# Patient Record
Sex: Male | Born: 1950 | Race: White | Hispanic: No | Marital: Married | Smoking: Never smoker
Health system: Southern US, Community
[De-identification: ages and names within clinical notes are randomized; demographics above are authoritative.]

## PROBLEM LIST (undated history)

## (undated) DIAGNOSIS — M5126 Other intervertebral disc displacement, lumbar region: Secondary | ICD-10-CM

## (undated) DIAGNOSIS — M1990 Unspecified osteoarthritis, unspecified site: Secondary | ICD-10-CM

## (undated) DIAGNOSIS — N189 Chronic kidney disease, unspecified: Secondary | ICD-10-CM

## (undated) DIAGNOSIS — G4730 Sleep apnea, unspecified: Secondary | ICD-10-CM

## (undated) DIAGNOSIS — I10 Essential (primary) hypertension: Secondary | ICD-10-CM

## (undated) HISTORY — PX: VASECTOMY: SHX75

## (undated) HISTORY — PX: TONSILLECTOMY: SUR1361

## (undated) HISTORY — PX: FRACTURE SURGERY: SHX138

## (undated) HISTORY — PX: APPENDECTOMY: SHX54

## (undated) HISTORY — PX: SHOULDER ARTHROSCOPY: SHX128

## (undated) HISTORY — PX: EYE SURGERY: SHX253

---

## 2004-10-13 ENCOUNTER — Encounter: Payer: Self-pay | Admitting: Internal Medicine

## 2004-10-24 ENCOUNTER — Encounter: Payer: Self-pay | Admitting: Internal Medicine

## 2004-12-01 ENCOUNTER — Ambulatory Visit: Payer: Self-pay | Admitting: Gastroenterology

## 2005-03-04 ENCOUNTER — Ambulatory Visit: Payer: Self-pay | Admitting: Gastroenterology

## 2005-04-08 ENCOUNTER — Ambulatory Visit: Payer: Self-pay | Admitting: Surgery

## 2006-11-04 ENCOUNTER — Ambulatory Visit: Payer: Self-pay | Admitting: Internal Medicine

## 2006-11-15 ENCOUNTER — Ambulatory Visit: Payer: Self-pay | Admitting: Internal Medicine

## 2008-09-23 ENCOUNTER — Emergency Department: Payer: Self-pay | Admitting: Emergency Medicine

## 2010-04-29 ENCOUNTER — Ambulatory Visit: Payer: Self-pay | Admitting: Family Medicine

## 2010-04-30 ENCOUNTER — Ambulatory Visit: Payer: Self-pay | Admitting: Urology

## 2012-04-26 HISTORY — PX: BACK SURGERY: SHX140

## 2012-07-07 ENCOUNTER — Ambulatory Visit: Payer: Self-pay | Admitting: Internal Medicine

## 2012-08-21 IMAGING — CT CT STONE STUDY
1 of 2 series · 16 of 32 positions shown, 20 images · non-contrast
Comparison: none

REASON FOR EXAM: CR  [id number]  flank pain hematuria  eval for kidney stones
COMMENTS:

PROCEDURE:     KCT - KCT ABDOMEN/PELVIS WO (STONE)  - April 29, 2010 [DATE]
RESULT:     History: Flank pain. Hematuria.

[Series 2: stonelg 3.0 i40s · axial · 0.96mm/px · z∈[-1233,-741]mm · 16 of 180 slices shown, 20 images]
[im 8/180  soft-tissue]
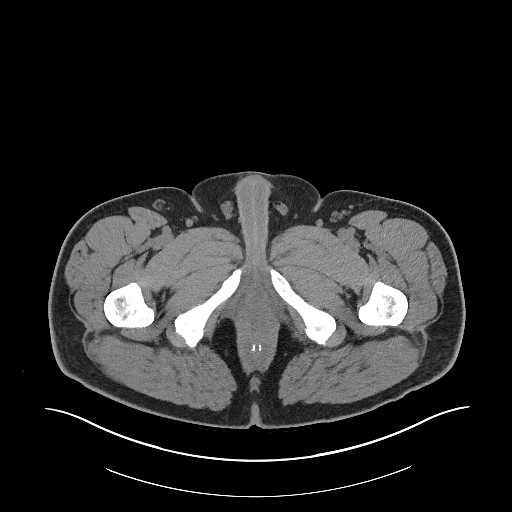
[im 8/180  bone]
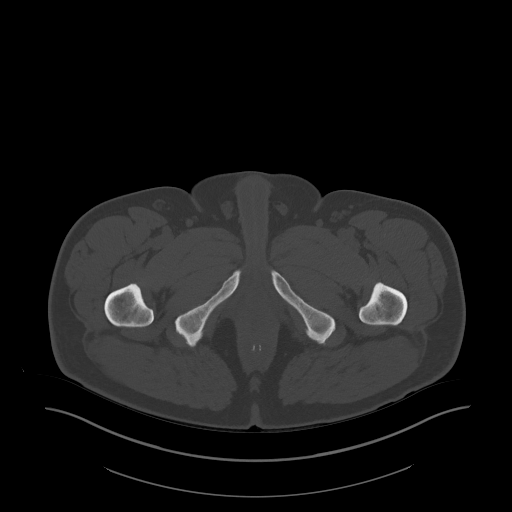
[im 22/180  soft-tissue]
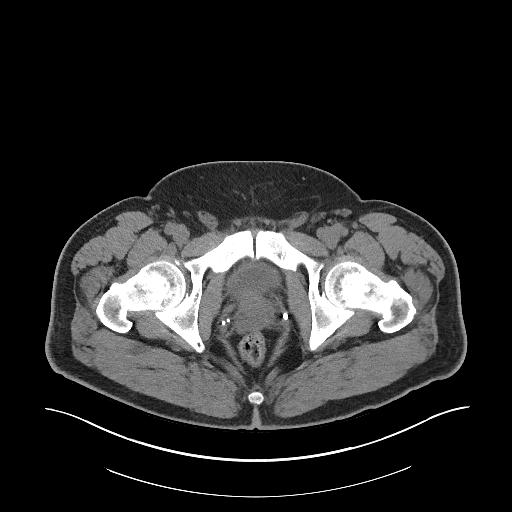
[im 36/180  soft-tissue]
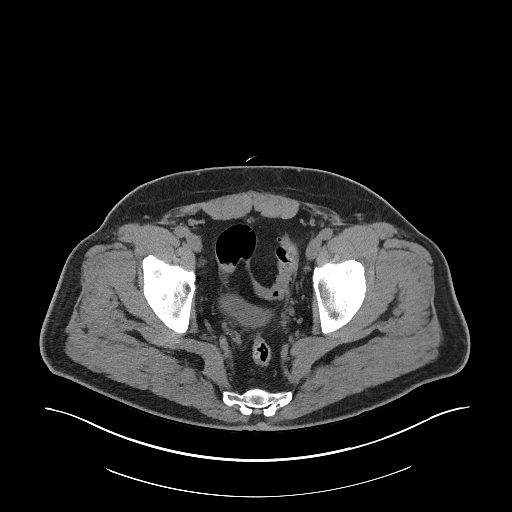
[im 51/180  soft-tissue]
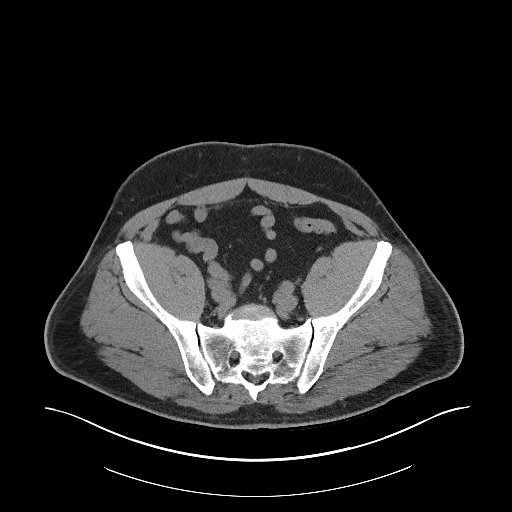
[im 58/180  soft-tissue]
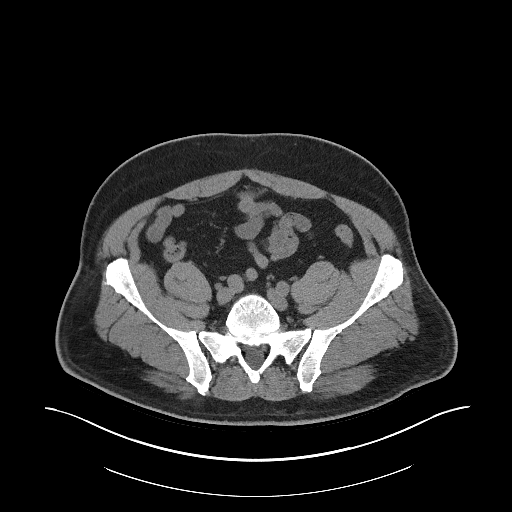
[im 72/180  soft-tissue]
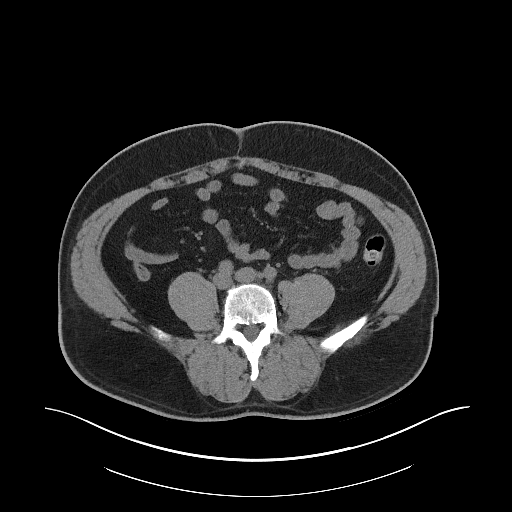
[im 86/180  soft-tissue]
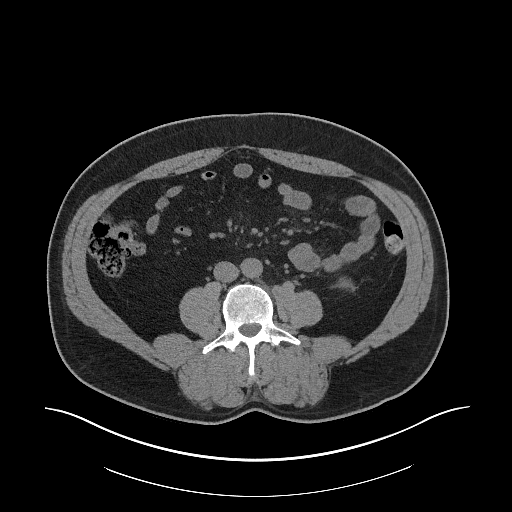
[im 94/180  soft-tissue]
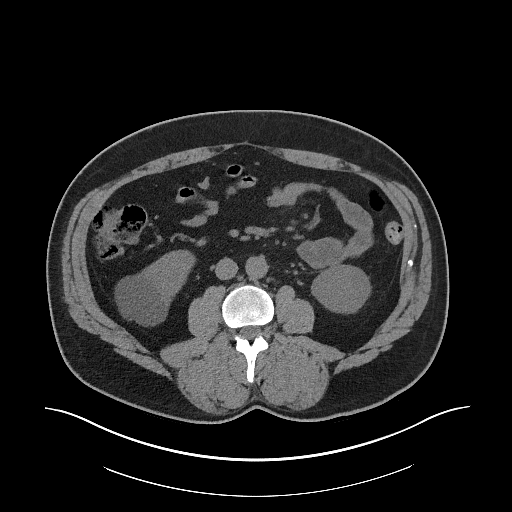
[im 108/180  soft-tissue]
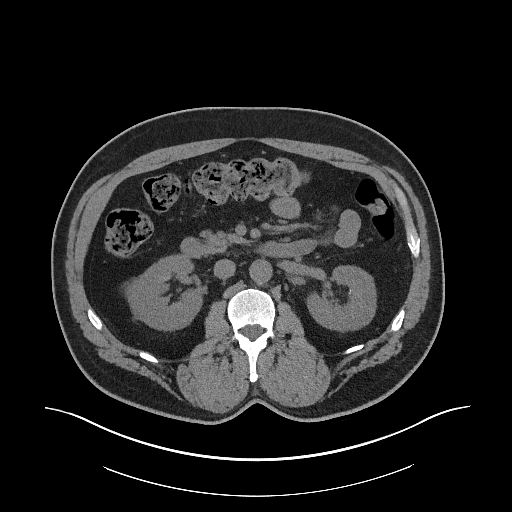
[im 108/180  bone]
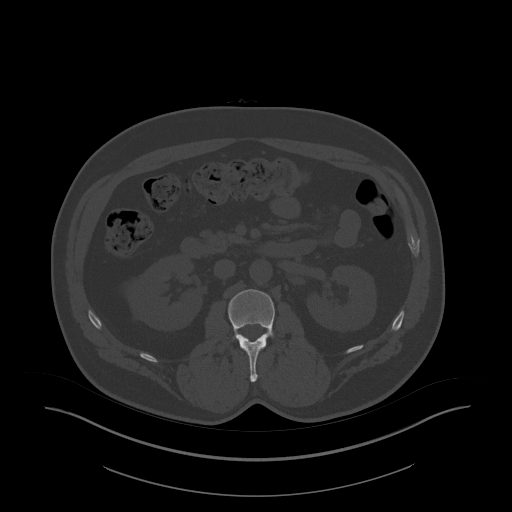
[im 122/180  soft-tissue]
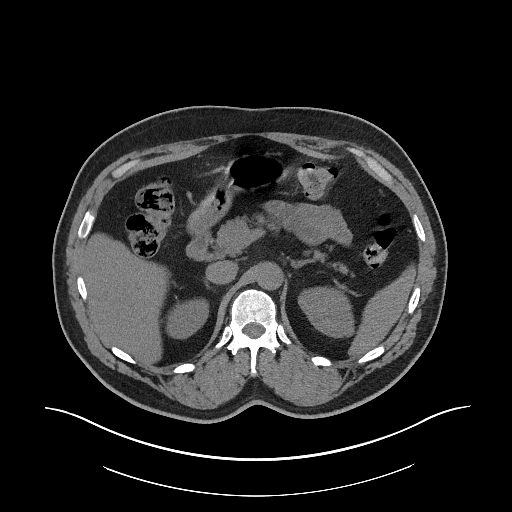
[im 137/180  soft-tissue]
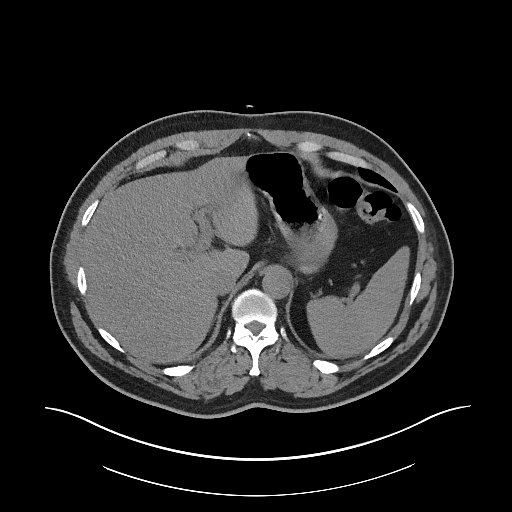
[im 144/180  soft-tissue]
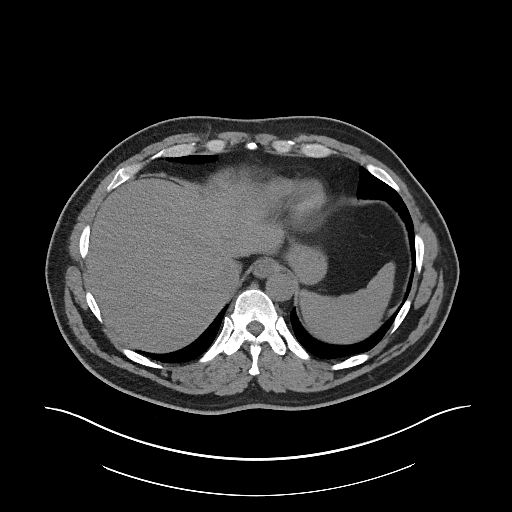
[im 151/180  lung]
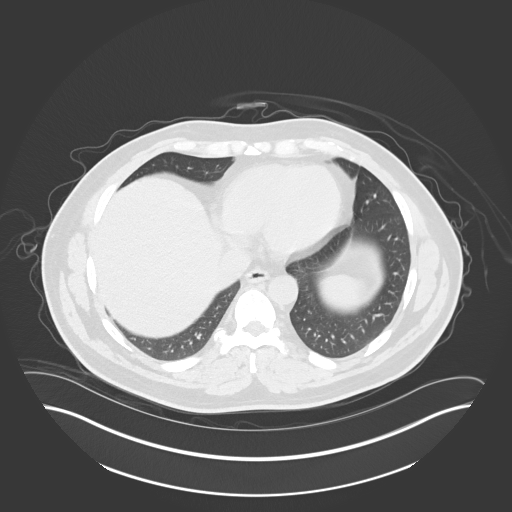
[im 158/180  soft-tissue]
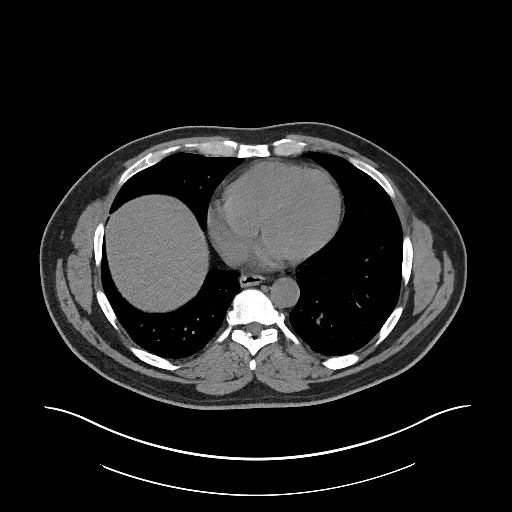
[im 158/180  lung]
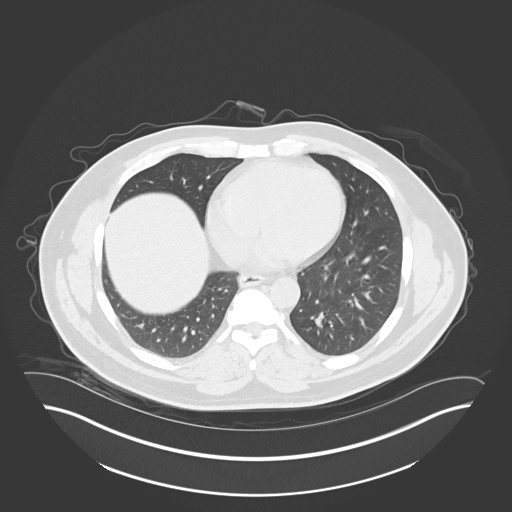
[im 165/180  lung]
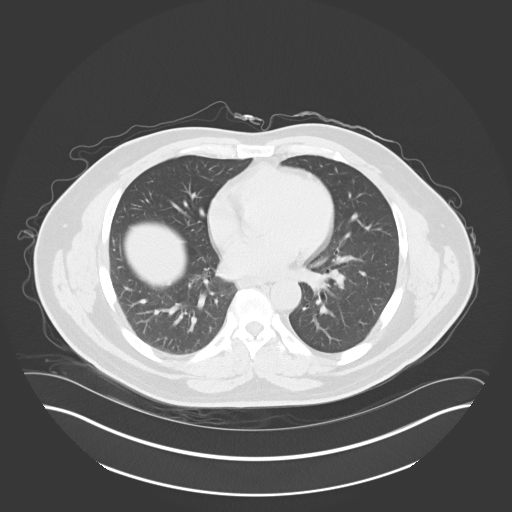
[im 172/180  soft-tissue]
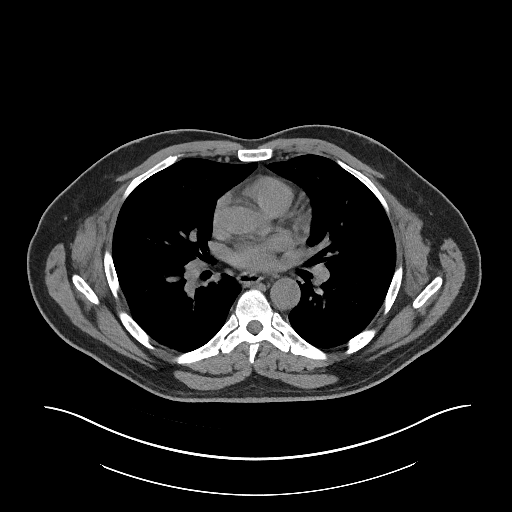
[im 172/180  lung]
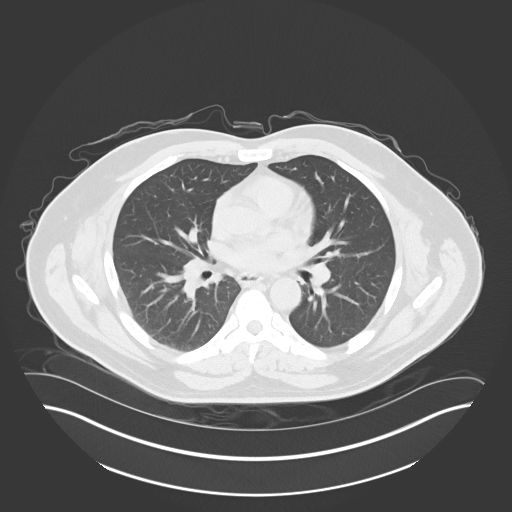

[16 of 32 positions shown; findings below may reference images not displayed]

FINDINGS: Liver normal. Spleen normal. Pancreas normal. Adrenals normal.
Bilateral nephrolithiasis noted. Large right renal cyst present. No
hydronephrosis or hydroureter. Innumerable calcified densities of the pelvis
consistent phleboliths. Tiny distal ureteral stone on either side cannot be
excluded. Bladder is nondistended. Left and right lower quadrants are
unremarkable. Patient patient had a prior appendectomy. Aorta nondistended.
Lung bases clear.
IMPRESSION: Bilateral nephrolithiasis. Innumerable pelvic calcifications are noted most
likely phleboliths. Nonobstructing distal ureteral stone cannot be excluded.

## 2012-09-04 ENCOUNTER — Ambulatory Visit: Payer: Self-pay | Admitting: Internal Medicine

## 2014-12-28 IMAGING — US US RENAL KIDNEY
1 series · 14 of 25 positions shown · non-contrast
Comparison: none

REASON FOR EXAM: kidney lesion found on MRI
COMMENTS:

[Series 1: us renal kidney · 0.31mm/px · 14 of 28 slices shown]
[im 1/28]
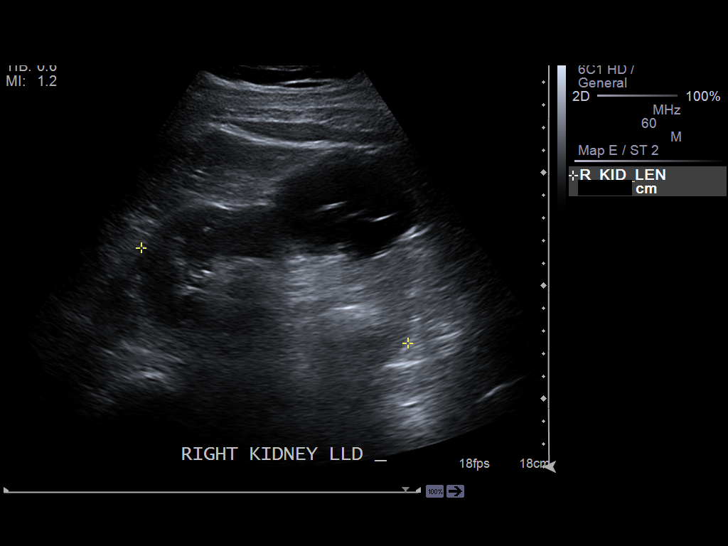
[im 3/28]
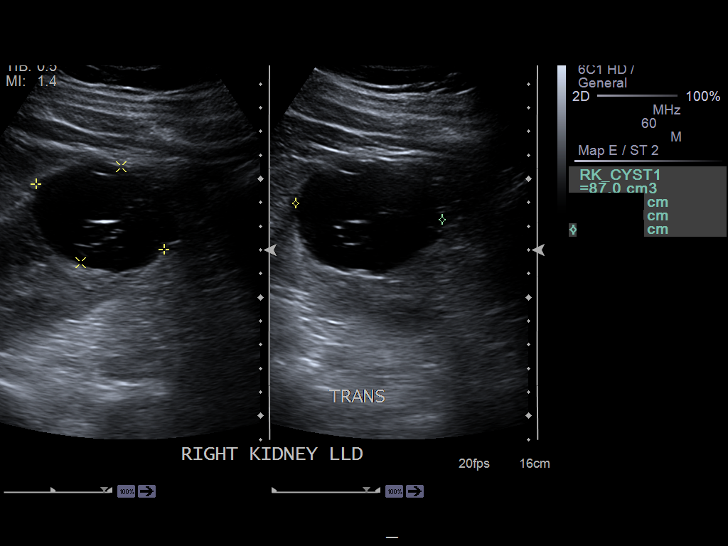
[im 5/28]
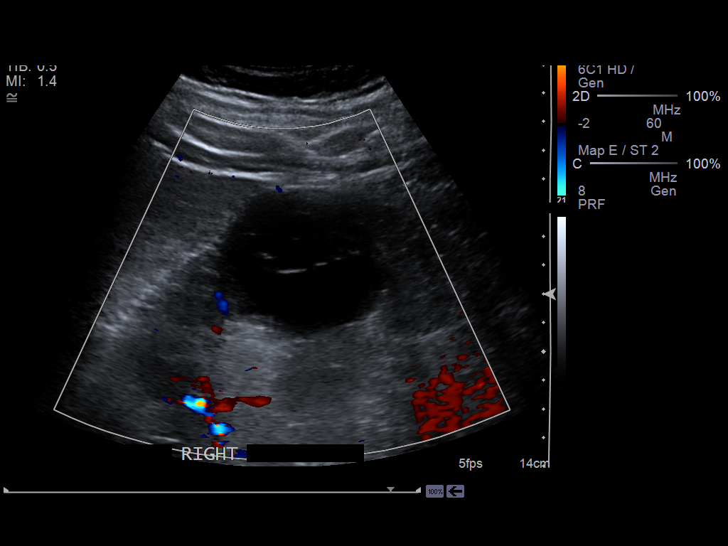
[im 7/28]
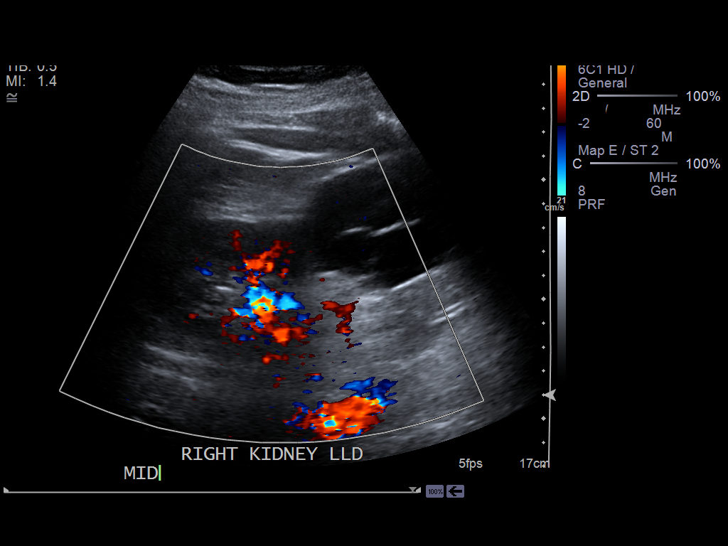
[im 10/28]
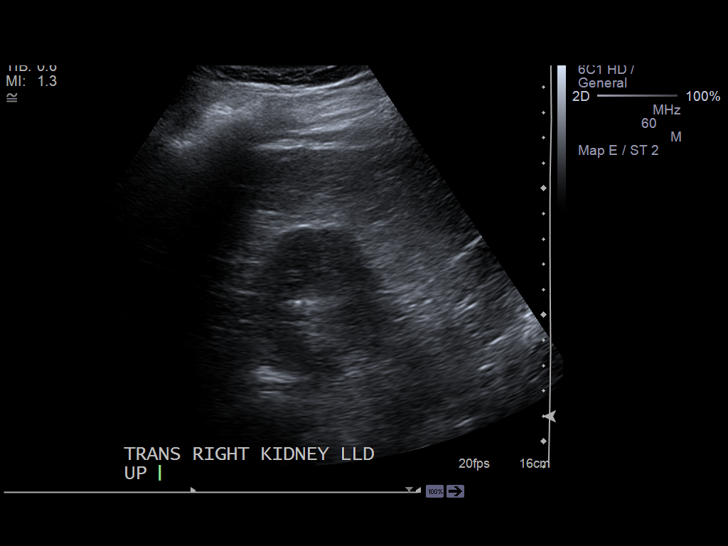
[im 11/28]
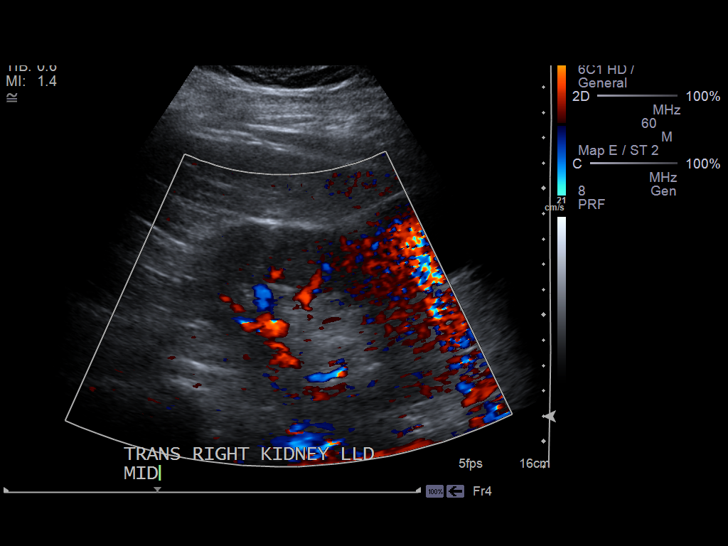
[im 13/28]
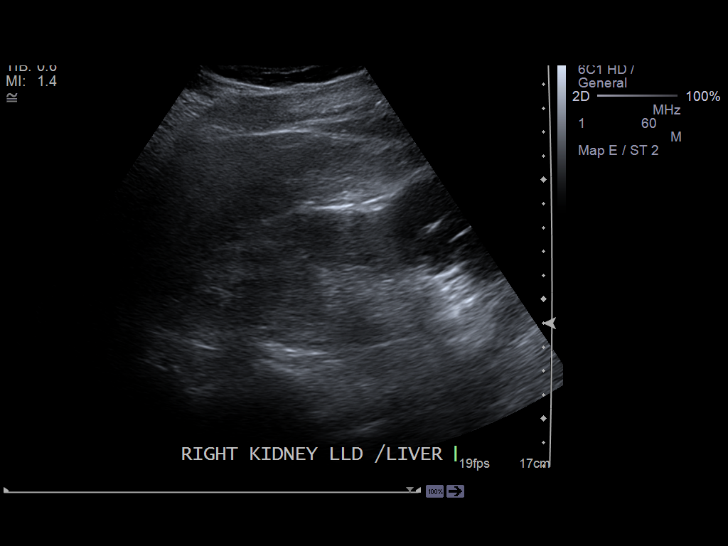
[im 15/28]
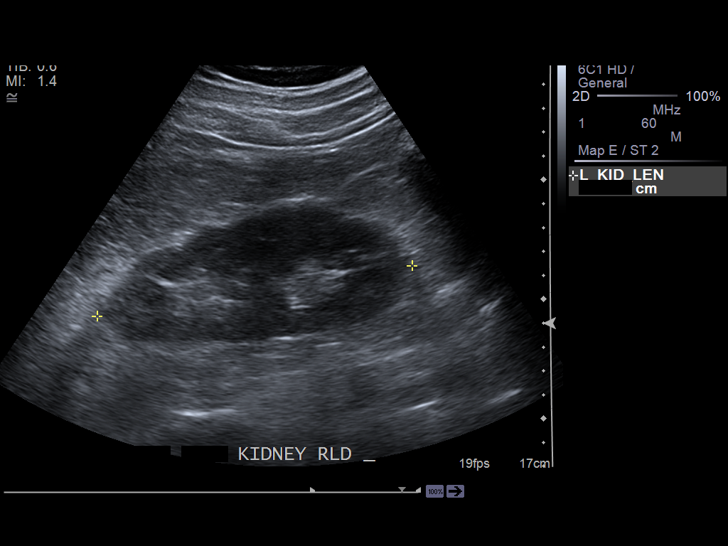
[im 17/28]
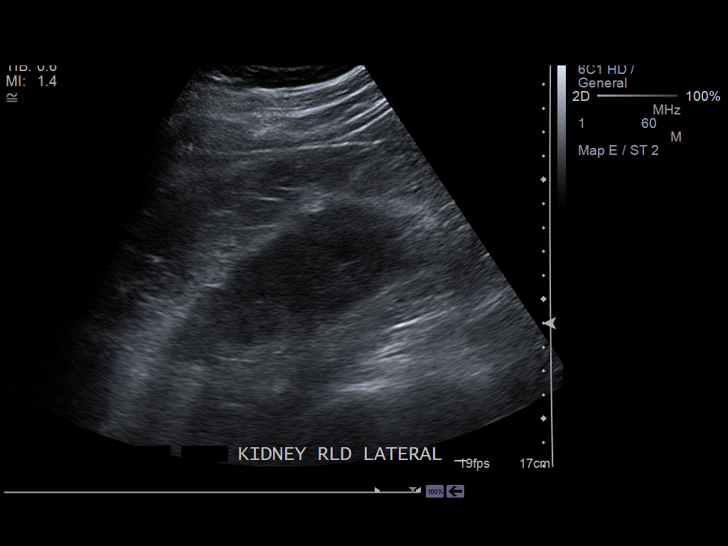
[im 19/28]
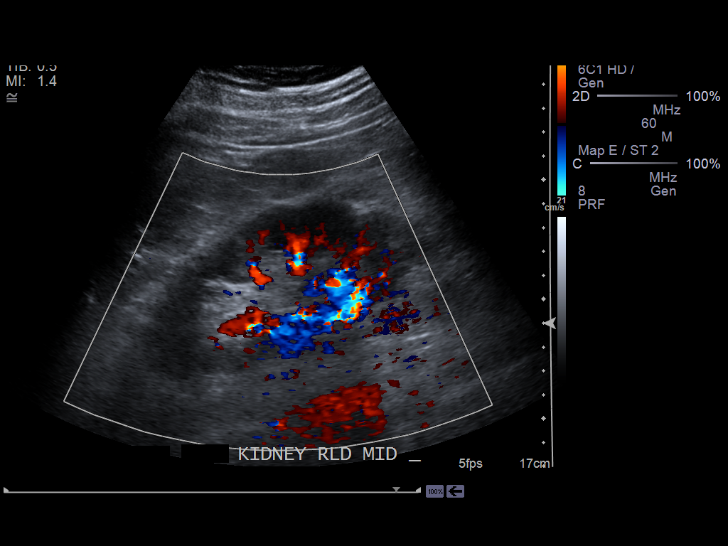
[im 21/28]
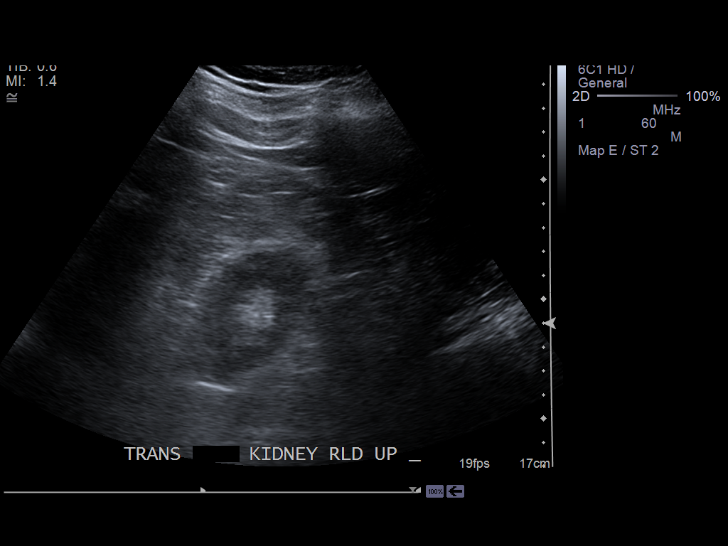
[im 23/28]
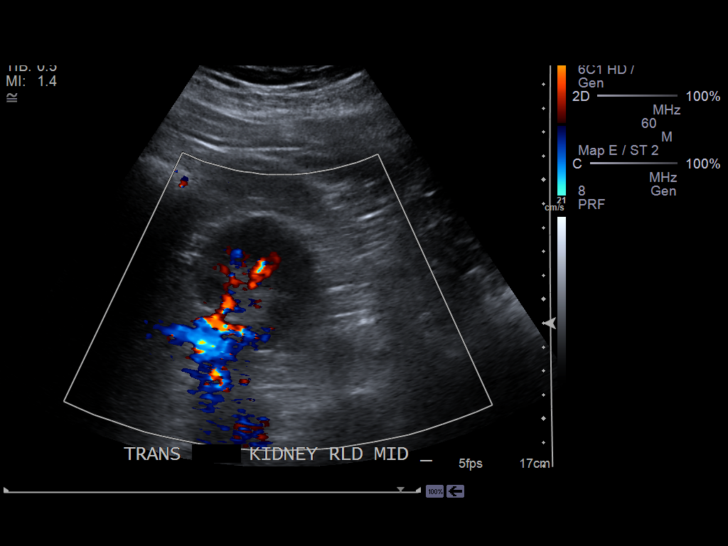
[im 25/28]
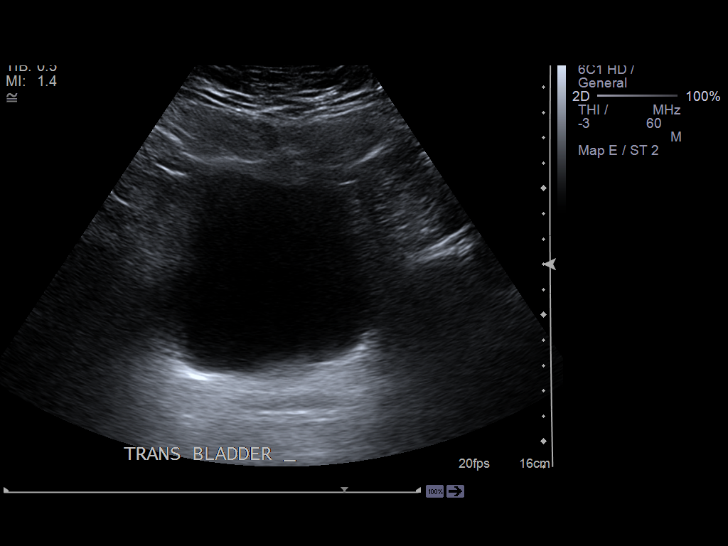
[im 28/28]
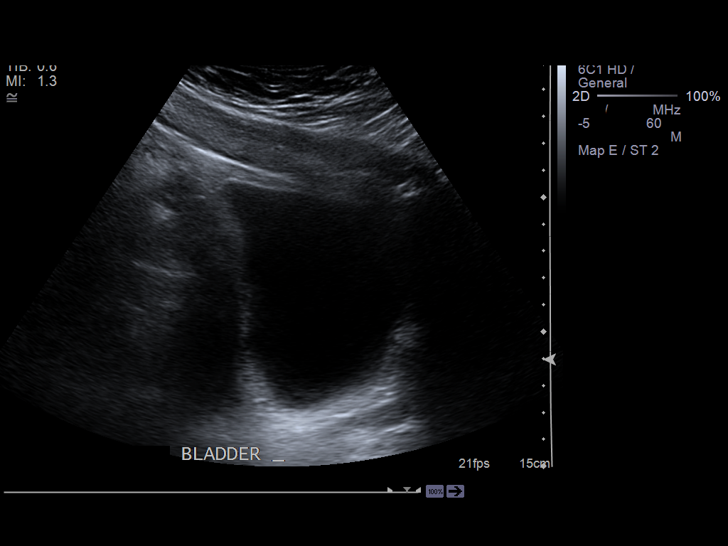

[14 of 25 positions shown; findings below may reference images not displayed]

PROCEDURE:     US  - US KIDNEY  - September 04, 2012  [DATE]

RESULT:     The kidneys are normal in contour with the exception of an
exophytic complex-appearing cystic structure associated with the lower pole
of the right kidney. The overall dimensions of the right kidney are 12.5 x
6.7 x 5.8 cm. There is complex cystic structure measures 6.1 x 4.4 x 6.2 cm.

The left kidney exhibits normal echotexture and measures 13.4 x 6 x 4.8 cm.
Neither kidney exhibits hydronephrosis.
IMPRESSION: There is a complex appearing cystic structure measuring up
to 6.2 cm in greatest dimension associated with the lower pole of the right
kidney. A noncontrast CT scan performed in April 2010 revealed a hypodense
presumably cystic structure measuring approximately 6.2 cm maximally. A
similar but smaller finding was noted on CT scan October 2006.

A followup triphasic renal CT scan is recommended if the patient can
tolerate IV contra[REDACTED]

## 2018-05-16 ENCOUNTER — Encounter: Payer: Self-pay | Admitting: *Deleted

## 2018-05-17 ENCOUNTER — Ambulatory Visit: Payer: Medicare Other | Admitting: Anesthesiology

## 2018-05-17 ENCOUNTER — Encounter: Payer: Self-pay | Admitting: Anesthesiology

## 2018-05-17 ENCOUNTER — Ambulatory Visit
Admission: RE | Admit: 2018-05-17 | Discharge: 2018-05-17 | Disposition: A | Discharge status: Home / Self Care | Payer: Medicare Other | Attending: Internal Medicine | Admitting: Internal Medicine

## 2018-05-17 ENCOUNTER — Encounter
Admission: RE | Disposition: A | Discharge status: Home / Self Care | Payer: Self-pay | Source: Home / Self Care | Attending: Internal Medicine

## 2018-05-17 DIAGNOSIS — K642 Third degree hemorrhoids: Secondary | ICD-10-CM | POA: Insufficient documentation

## 2018-05-17 DIAGNOSIS — Z79899 Other long term (current) drug therapy: Secondary | ICD-10-CM | POA: Diagnosis not present

## 2018-05-17 DIAGNOSIS — M1990 Unspecified osteoarthritis, unspecified site: Secondary | ICD-10-CM | POA: Insufficient documentation

## 2018-05-17 DIAGNOSIS — K2970 Gastritis, unspecified, without bleeding: Secondary | ICD-10-CM | POA: Insufficient documentation

## 2018-05-17 DIAGNOSIS — K5900 Constipation, unspecified: Secondary | ICD-10-CM | POA: Insufficient documentation

## 2018-05-17 DIAGNOSIS — M5126 Other intervertebral disc displacement, lumbar region: Secondary | ICD-10-CM | POA: Diagnosis not present

## 2018-05-17 DIAGNOSIS — K635 Polyp of colon: Secondary | ICD-10-CM | POA: Diagnosis not present

## 2018-05-17 DIAGNOSIS — Z86010 Personal history of colonic polyps: Secondary | ICD-10-CM | POA: Insufficient documentation

## 2018-05-17 DIAGNOSIS — Z885 Allergy status to narcotic agent status: Secondary | ICD-10-CM | POA: Diagnosis not present

## 2018-05-17 DIAGNOSIS — D1779 Benign lipomatous neoplasm of other sites: Secondary | ICD-10-CM | POA: Insufficient documentation

## 2018-05-17 DIAGNOSIS — Z800 Family history of malignant neoplasm of digestive organs: Secondary | ICD-10-CM | POA: Insufficient documentation

## 2018-05-17 DIAGNOSIS — D509 Iron deficiency anemia, unspecified: Secondary | ICD-10-CM | POA: Diagnosis present

## 2018-05-17 DIAGNOSIS — D123 Benign neoplasm of transverse colon: Secondary | ICD-10-CM | POA: Insufficient documentation

## 2018-05-17 DIAGNOSIS — K5730 Diverticulosis of large intestine without perforation or abscess without bleeding: Secondary | ICD-10-CM | POA: Insufficient documentation

## 2018-05-17 DIAGNOSIS — K644 Residual hemorrhoidal skin tags: Secondary | ICD-10-CM | POA: Diagnosis not present

## 2018-05-17 DIAGNOSIS — I10 Essential (primary) hypertension: Secondary | ICD-10-CM | POA: Insufficient documentation

## 2018-05-17 DIAGNOSIS — Z87442 Personal history of urinary calculi: Secondary | ICD-10-CM | POA: Diagnosis not present

## 2018-05-17 DIAGNOSIS — G2581 Restless legs syndrome: Secondary | ICD-10-CM | POA: Diagnosis not present

## 2018-05-17 DIAGNOSIS — G4730 Sleep apnea, unspecified: Secondary | ICD-10-CM | POA: Insufficient documentation

## 2018-05-17 DIAGNOSIS — Z791 Long term (current) use of non-steroidal anti-inflammatories (NSAID): Secondary | ICD-10-CM | POA: Insufficient documentation

## 2018-05-17 HISTORY — DX: Sleep apnea, unspecified: G47.30

## 2018-05-17 HISTORY — DX: Other intervertebral disc displacement, lumbar region: M51.26

## 2018-05-17 HISTORY — PX: COLONOSCOPY WITH PROPOFOL: SHX5780

## 2018-05-17 HISTORY — DX: Essential (primary) hypertension: I10

## 2018-05-17 HISTORY — DX: Chronic kidney disease, unspecified: N18.9

## 2018-05-17 HISTORY — DX: Unspecified osteoarthritis, unspecified site: M19.90

## 2018-05-17 HISTORY — PX: ESOPHAGOGASTRODUODENOSCOPY (EGD) WITH PROPOFOL: SHX5813

## 2018-05-17 SURGERY — COLONOSCOPY WITH PROPOFOL
Anesthesia: General

## 2018-05-17 MED ORDER — SODIUM CHLORIDE 0.9 % IV SOLN
INTRAVENOUS | Status: DC
  Administered 2018-05-17: 14:00:00 via INTRAVENOUS
  Administered 2018-05-17: 1000 mL via INTRAVENOUS

## 2018-05-17 MED ORDER — PROPOFOL 10 MG/ML IV BOLUS
INTRAVENOUS | Status: DC | PRN
  Administered 2018-05-17 (×4): 50 mg via INTRAVENOUS

## 2018-05-17 MED ORDER — PROPOFOL 500 MG/50ML IV EMUL
INTRAVENOUS | Status: AC
  Filled 2018-05-17: qty 50

## 2018-05-17 MED ORDER — PROPOFOL 500 MG/50ML IV EMUL
INTRAVENOUS | Status: DC | PRN
  Administered 2018-05-17: 80 ug/kg/min via INTRAVENOUS

## 2018-05-17 NOTE — Op Note (Signed)
[facility] Gastroenterology Patient Name: Jerry Boyd Procedure Date: 05/17/2018 1:43 PM MRN: [id number] Account #: [id number] Date of Birth: 15-May-1950 Admit Type: Outpatient Age: 68 Room: [facility] ENDO ROOM 3 Gender: Male Note Status: Finalized Procedure:            Upper GI endoscopy Indications:          Suspected upper gastrointestinal bleeding in patient                        with unexplained iron deficiency anemia Providers:            Benay Pike. [name] MD, MD Referring MD:         Rusty Aus, MD (Referring MD) Medicines:            Propofol per Anesthesia Complications:        No immediate complications. Procedure:            Pre-Anesthesia Assessment:                       - The risks and benefits of the procedure and the                        sedation options and risks were discussed with the                        patient. All questions were answered and informed                        consent was obtained.                       - Patient identification and proposed procedure were                        verified prior to the procedure by the nurse. The                        procedure was verified in the procedure room.                       - ASA Grade Assessment: III - A patient with severe                        systemic disease.                       - After reviewing the risks and benefits, the patient                        was deemed in satisfactory condition to undergo the                        procedure.                       After obtaining informed consent, the endoscope was                        passed under direct vision. Throughout the procedure,  the patient's blood pressure, pulse, and oxygen                        saturations were monitored continuously. The Endoscope                        was introduced through the mouth, and advanced to the                        third part of duodenum. The upper GI  endoscopy was                        accomplished without difficulty. The patient tolerated                        the procedure well. Findings:      The examined esophagus was normal.      Patchy mild inflammation characterized by congestion (edema) and       erosions was found in the gastric body and in the gastric antrum.       Biopsies were taken with a cold forceps for Helicobacter pylori testing.      The examined duodenum was normal. Biopsies for histology were taken with       a cold forceps for evaluation of celiac disease.      The exam was otherwise without abnormality. Impression:           - Normal esophagus.                       - Gastritis. Biopsied.                       - Normal examined duodenum. Biopsied.                       - The examination was otherwise normal. Recommendation:       - Await pathology results.                       - Proceed with colonoscopy Procedure Code(s):    --- Professional ---                       [phone], Esophagogastroduodenoscopy, flexible, transoral;                        with biopsy, single or multiple Diagnosis Code(s):    --- Professional ---                       D50.9, Iron deficiency anemia, unspecified                       K29.70, Gastritis, unspecified, without bleeding CPT copyright 2018 American Medical Association. All rights reserved. The codes documented in this report are preliminary and upon coder review may  be revised to meet current compliance requirements. Efrain Sella MD, MD 05/17/2018 1:58:21 PM This report has been signed electronically. Number of Addenda: 0 Note Initiated On: 05/17/2018 1:43 PM      [facility]

## 2018-05-17 NOTE — Anesthesia Post-op Follow-up Note (Signed)
Anesthesia QCDR form completed.        

## 2018-05-17 NOTE — Transfer of Care (Signed)
Immediate Anesthesia Transfer of Care Note  Patient: Jerry Boyd  Procedure(s) Performed: COLONOSCOPY WITH PROPOFOL (N/A ) ESOPHAGOGASTRODUODENOSCOPY (EGD) WITH PROPOFOL (N/A )  Patient Location: PACU  Anesthesia Type:General  Level of Consciousness: awake  Airway & Oxygen Therapy: Patient Spontanous Breathing  Post-op Assessment: Report given to RN  Post vital signs: stable  Last Vitals:  Vitals Value Taken Time  BP 114/73 05/17/2018  2:17 PM  Temp    Pulse 71 05/17/2018  2:17 PM  Resp 16 05/17/2018  2:17 PM  SpO2 94 % 05/17/2018  2:17 PM  Vitals shown include unvalidated device data.  Last Pain:  Vitals:   05/17/18 1247  TempSrc: Tympanic      Patients Stated Pain Goal: 0 (81/18/86 7737)  Complications: No apparent anesthesia complications

## 2018-05-17 NOTE — Interval H&P Note (Signed)
History and Physical Interval Note:  05/17/2018 1:42 PM  Jerry Boyd  has presented today for surgery, with the diagnosis of PH COLON POLYPS ANEMIA  The various methods of treatment have been discussed with the patient and family. After consideration of risks, benefits and other options for treatment, the patient has consented to  Procedure(s): COLONOSCOPY WITH PROPOFOL (N/A) ESOPHAGOGASTRODUODENOSCOPY (EGD) WITH PROPOFOL (N/A) as a surgical intervention .  The patient's history has been reviewed, patient examined, no change in status, stable for surgery.  I have reviewed the patient's chart and labs.  Questions were answered to the patient's satisfaction.     [place], [place]

## 2018-05-17 NOTE — Op Note (Signed)
[facility] Gastroenterology Patient Name: Jerry Boyd Procedure Date: 05/17/2018 1:42 PM MRN: [id number] Account #: [id number] Date of Birth: [birth year] Admit Type: Outpatient Age: 68 Room: [facility] ENDO ROOM 3 Gender: Male Note Status: Finalized Procedure:            Colonoscopy Indications:          Unexplained iron deficiency anemia Providers:            Benay Pike. [name] MD, MD Referring MD:         Rusty Aus, MD (Referring MD) Medicines:            Propofol per Anesthesia Complications:        No immediate complications. Procedure:            Pre-Anesthesia Assessment:                       - The risks and benefits of the procedure and the                        sedation options and risks were discussed with the                        patient. All questions were answered and informed                        consent was obtained.                       - Patient identification and proposed procedure were                        verified prior to the procedure by the nurse. The                        procedure was verified in the procedure room.                       - ASA Grade Assessment: III - A patient with severe                        systemic disease.                       - After reviewing the risks and benefits, the patient                        was deemed in satisfactory condition to undergo the                        procedure.                       After obtaining informed consent, the colonoscope was                        passed under direct vision. Throughout the procedure,                        the patient's blood pressure, pulse, and oxygen  saturations were monitored continuously. The                        Colonoscope was introduced through the anus and                        advanced to the the cecum, identified by appendiceal                        orifice and ileocecal valve. The colonoscopy was                         performed without difficulty. The patient tolerated the                        procedure well. The quality of the bowel preparation                        was good. The ileocecal valve, appendiceal orifice, and                        rectum were photographed. Findings:      The perianal exam findings include non-thrombosed external hemorrhoids.      The digital rectal exam was normal. Pertinent negatives include normal       sphincter tone.      There was a medium-sized lipoma, 12 mm in diameter, in the ascending       colon. Biopsies were taken with a cold forceps for histology.      A 5 mm polyp was found in the distal transverse colon. The polyp was       sessile. The polyp was removed with a jumbo cold forceps. Resection and       retrieval were complete.      Multiple small and large-mouthed diverticula were found in the sigmoid       colon and descending colon.      The exam was otherwise without abnormality on direct and retroflexion       views. Impression:           - Non-thrombosed external hemorrhoids and internal                        hemorrhoids that prolapse with straining, but require                        manual replacement into the anal canal (Grade III)                        found on perianal exam.                       - Medium-sized lipoma in the ascending colon. Biopsied.                       - One 5 mm polyp in the distal transverse colon,                        removed with a jumbo cold forceps. Resected and                        retrieved.                       -  Diverticulosis in the sigmoid colon and in the                        descending colon.                       - The examination was otherwise normal on direct and                        retroflexion views. Recommendation:       - Patient has a contact number available for                        emergencies. The signs and symptoms of potential                        delayed complications were  discussed with the patient.                        Return to normal activities tomorrow. Written discharge                        instructions were provided to the patient.                       - Await pathology results from EGD, also performed                        today.                       - Resume previous diet.                       - Continue present medications.                       - Repeat colonoscopy is recommended for surveillance.                        The colonoscopy date will be determined after pathology                        results from today's exam become available for review.                       - Return to physician assistant in 3 months. Procedure Code(s):    --- Professional ---                       [phone], Colonoscopy, flexible; with biopsy, single or                        multiple Diagnosis Code(s):    --- Professional ---                       K57.30, Diverticulosis of large intestine without                        perforation or abscess without bleeding                       D50.9, Iron  deficiency anemia, unspecified                       K64.4, Residual hemorrhoidal skin tags                       K64.2, Third degree hemorrhoids                       D12.3, Benign neoplasm of transverse colon (hepatic                        flexure or splenic flexure)                       D17.5, Benign lipomatous neoplasm of intra-abdominal                        organs CPT copyright 2018 American Medical Association. All rights reserved. The codes documented in this report are preliminary and upon coder review may  be revised to meet current compliance requirements. Efrain Sella MD, MD 05/17/2018 2:21:27 PM This report has been signed electronically. Number of Addenda: 0 Note Initiated On: 05/17/2018 1:42 PM Scope Withdrawal Time: 0 hours 9 minutes 21 seconds  Total Procedure Duration: 0 hours 12 minutes 37 seconds       [facility]

## 2018-05-17 NOTE — Anesthesia Preprocedure Evaluation (Addendum)
Anesthesia Evaluation  Patient identified by MRN, date of birth, ID band Patient awake    Reviewed: Allergy & Precautions, NPO status , Patient's Chart, lab work & pertinent test results, reviewed documented beta blocker date and time   Airway Mallampati: III  TM Distance: >3 FB     Dental  (+) Upper Dentures, Lower Dentures   Pulmonary sleep apnea ,           Cardiovascular hypertension, Pt. on medications      Neuro/Psych    GI/Hepatic   Endo/Other    Renal/GU Renal disease     Musculoskeletal  (+) Arthritis ,   Abdominal   Peds  Hematology   Anesthesia Other Findings Takes mirapex for RLS.  Reproductive/Obstetrics                            Anesthesia Physical Anesthesia Plan  ASA: III  Anesthesia Plan: General   Post-op Pain Management:    Induction: Intravenous  PONV Risk Score and Plan:   Airway Management Planned:   Additional Equipment:   Intra-op Plan:   Post-operative Plan:   Informed Consent: I have reviewed the patients History and Physical, chart, labs and discussed the procedure including the risks, benefits and alternatives for the proposed anesthesia with the patient or authorized representative who has indicated his/her understanding and acceptance.       Plan Discussed with: CRNA  Anesthesia Plan Comments:         Anesthesia Quick Evaluation

## 2018-05-17 NOTE — H&P (Signed)
Outpatient short stay form Pre-procedure 05/17/2018 1:38 PM Jerry Boyd K. [name], M.D.  Primary Physician: Emily Filbert, MD  Reason for visit: Iron deficiency anemia, personal history of colon polyps, family history of colon cancer.  History of present illness: 68 year old male presents with a diagnosis of iron deficiency anemia and a family history of colon cancer in both parents.  Last colonoscopy in 2014 some retained stool of liquid consistency.  2012 also a suboptimal prep was noted on colonoscopy with removal of one tubular adenomatous polyp.  Patient has mild constipation, the patient denies any lower GI symptoms.  Besides mild nausea, the patient denies any upper GI symptoms.    Current Facility-Administered Medications:  .  0.9 %  sodium chloride infusion, , Intravenous, Continuous, [place], Benay Pike, MD, Last Rate: 20 mL/hr at 05/17/18 1309  Medications Prior to Admission  Medication Sig Dispense Refill Last Dose  . acetaminophen (TYLENOL) 650 MG CR tablet Take 650 mg by mouth as needed for pain (Take 3 tabs at night (2-3x/week).   Past Week at Unknown time  . cetirizine (ZYRTEC) 10 MG tablet Take 10 mg by mouth daily.   Past Week at Unknown time  . CHOLECALCIFEROL-VITAMIN C PO Take 2,000 Units by mouth daily.   Past Week at Unknown time  . ferrous gluconate (FERGON) 324 MG tablet Take 324 mg by mouth daily with breakfast.   Past Week at Unknown time  . HYDROcodone-acetaminophen (NORCO) 10-325 MG tablet Take 1 tablet by mouth 2 (two) times daily as needed.   Past Week at Unknown time  . nabumetone (RELAFEN) 500 MG tablet Take 500 mg by mouth 2 (two) times daily.   Past Week at Unknown time  . olmesartan (BENICAR) 20 MG tablet Take 20 mg by mouth daily.   Past Week at Unknown time  . polyethylene glycol (MIRALAX / GLYCOLAX) packet Take 17 g by mouth daily. Mix in [place]. of fluid   Past Week at Unknown time  . pramipexole (MIRAPEX) 0.125 MG tablet Take 0.125 mg by mouth at bedtime.    05/16/2018 at Unknown time  . Pseudoephedrine-Ibuprofen 30-200 MG TABS Take by mouth as needed (Advil Cold and Sinus).   Past Week at Unknown time     Allergies  Allergen Reactions  . Codeine Rash     Past Medical History:  Diagnosis Date  . Arthritis    osteoarthritis  . Chronic kidney disease    kidney stones  . Hypertension   . Lumbar herniated disc    recurrent L4-L5  . Sleep apnea    uses CPAP    Review of systems:  Otherwise negative.    Physical Exam  Gen: Alert, oriented. Appears stated age.  HEENT: /AT. PERRLA. Lungs: CTA, no wheezes. CV: RR nl S1, S2. Abd: soft, benign, no masses. BS+ Ext: No edema. Pulses 2+    Planned procedures: Proceed with EGD and colonoscopy. The patient understands the nature of the planned procedure, indications, risks, alternatives and potential complications including but not limited to bleeding, infection, perforation, damage to internal organs and possible oversedation/side effects from anesthesia. The patient agrees and gives consent to proceed.  Please refer to procedure notes for findings, recommendations and patient disposition/instructions.     Antrice Pal K. [name], M.D. Gastroenterology 05/17/2018  1:38 PM

## 2018-05-18 ENCOUNTER — Encounter: Payer: Self-pay | Admitting: Internal Medicine

## 2018-05-19 LAB — SURGICAL PATHOLOGY

## 2018-05-19 NOTE — Anesthesia Postprocedure Evaluation (Signed)
Anesthesia Post Note  Patient: Jerry Boyd  Procedure(s) Performed: COLONOSCOPY WITH PROPOFOL (N/A ) ESOPHAGOGASTRODUODENOSCOPY (EGD) WITH PROPOFOL (N/A )  Patient location during evaluation: Endoscopy Anesthesia Type: General Level of consciousness: awake and alert Pain management: pain level controlled Vital Signs Assessment: post-procedure vital signs reviewed and stable Respiratory status: spontaneous breathing, nonlabored ventilation and respiratory function stable Cardiovascular status: blood pressure returned to baseline and stable Postop Assessment: no apparent nausea or vomiting Anesthetic complications: no     Last Vitals:  Vitals:   05/17/18 1418 05/17/18 1445  BP: 114/73   Pulse: 66   Resp: (!) 22 20  Temp: 36.7 C   SpO2: 95%     Last Pain:  Vitals:   05/17/18 1425  TempSrc:   PainSc: 0-No pain                 Alphonsus Sias

## 2022-02-11 ENCOUNTER — Ambulatory Visit: Admission: RE | Admit: 2022-02-11 | Payer: Medicare Other | Source: Home / Self Care | Admitting: Cardiology

## 2022-02-11 ENCOUNTER — Encounter: Admission: RE | Payer: Self-pay | Source: Home / Self Care

## 2022-02-11 DIAGNOSIS — R9430 Abnormal result of cardiovascular function study, unspecified: Secondary | ICD-10-CM

## 2022-02-11 SURGERY — LEFT HEART CATH AND CORONARY ANGIOGRAPHY
Anesthesia: Moderate Sedation

## 2022-10-24 ENCOUNTER — Emergency Department
Admission: EM | Admit: 2022-10-24 | Discharge: 2022-10-24 | Disposition: A | Discharge status: Home / Self Care | Payer: Medicare Other | Attending: Emergency Medicine | Admitting: Emergency Medicine

## 2022-10-24 ENCOUNTER — Other Ambulatory Visit: Payer: Self-pay

## 2022-10-24 ENCOUNTER — Emergency Department: Payer: Medicare Other

## 2022-10-24 DIAGNOSIS — M545 Low back pain, unspecified: Secondary | ICD-10-CM | POA: Diagnosis present

## 2022-10-24 DIAGNOSIS — M5440 Lumbago with sciatica, unspecified side: Secondary | ICD-10-CM | POA: Diagnosis not present

## 2022-10-24 DIAGNOSIS — I10 Essential (primary) hypertension: Secondary | ICD-10-CM | POA: Diagnosis not present

## 2022-10-24 MED ORDER — FENTANYL CITRATE PF 50 MCG/ML IJ SOSY
50.0000 ug | PREFILLED_SYRINGE | Freq: Once | INTRAMUSCULAR | Status: AC
  Administered 2022-10-24: 50 ug via INTRAMUSCULAR
  Filled 2022-10-24: qty 1

## 2022-10-24 MED ORDER — KETOROLAC TROMETHAMINE 15 MG/ML IJ SOLN
15.0000 mg | Freq: Once | INTRAMUSCULAR | Status: AC
  Administered 2022-10-24: 15 mg via INTRAMUSCULAR
  Filled 2022-10-24: qty 1

## 2022-10-24 MED ORDER — CYCLOBENZAPRINE HCL 10 MG PO TABS
5.0000 mg | ORAL_TABLET | Freq: Once | ORAL | Status: AC
  Administered 2022-10-24: 5 mg via ORAL
  Filled 2022-10-24: qty 1

## 2022-10-24 MED ORDER — LIDOCAINE 5 % EX PTCH
1.0000 | MEDICATED_PATCH | CUTANEOUS | Status: DC
  Administered 2022-10-24: 1 via TRANSDERMAL
  Filled 2022-10-24: qty 1

## 2022-10-24 MED ORDER — KETOROLAC TROMETHAMINE 15 MG/ML IJ SOLN: 15.0000 mg | Freq: Once | INTRAMUSCULAR | Status: DC

## 2022-10-24 NOTE — ED Provider Notes (Signed)
[facility] Emergency Department Provider Note     Event Date/Time   First MD Initiated Contact with Patient 10/24/22 1351     (approximate)   History   Back Pain   HPI  Jerry Boyd is a 72 y.o. male with a medical history of hypertension, lumbar herniated disc and arthritis who presents to the ED for complaint of severe lower back pain x 2 days. Progressively worsening. No injury or trauma. Pain quality intermittent and sharp. Radiates down left leg. Sitting up straight makes pain tolerable. Patient reports taking tylenol and hydrocodone with no improvement of symptoms. Patient endorses surgical history in the same area of concern. Denies fever, numbness, tingling, and loss of bowel or bladder control    Physical Exam   Triage Vital Signs: ED Triage Vitals  Enc Vitals Group     BP 10/24/22 1324 128/75     Pulse Rate 10/24/22 1324 82     Resp 10/24/22 1324 (!) 28     Temp 10/24/22 1324 99 F (37.2 C)     Temp Source 10/24/22 1324 Oral     SpO2 10/24/22 1324 92 %     Weight 10/24/22 1328 253 lb 8.5 oz (115 kg)     Height 10/24/22 1328 5\' 9"  (1.753 m)     Head Circumference --      Peak Flow --      Pain Score 10/24/22 1327 10     Pain Loc --      Pain Edu? --      Excl. in GC? --     Most recent vital signs: Vitals:   10/24/22 1324 10/24/22 1640  BP: 128/75 131/70  Pulse: 82 78  Resp: (!) 28 18  Temp: 99 F (37.2 C)   SpO2: 92% 93%    General: Alert and oriented. Grimacing due to pain. Obvious discomfort.    Head:  NCAT.  Eyes:  PERRLA. EOMI. Neck:   No cervical spine tenderness to palpation. AROM without difficulty.  CV:  Good peripheral perfusion. RRR.   RESP:  Normal effort. LCTAB.  ABD:  No distention.    BACK:  Cervical and thoracic spinous process is midline without deformity or tenderness. Lumbar spine is midline, TTP. AROM with rotation is limited due to pain. (+) left SLRT. Left paralumbar muscle tenderness to palpation.    MSK:   Full ROM in all joints. No swelling, deformity or tenderness. Normal stance and gait with assistance of walker.  NEURO: Cranial nerves II-XII intact. No focal deficits. Sensation and motor function intact.   ED Results / Procedures / Treatments   Labs (all labs ordered are listed, but only abnormal results are displayed) Labs Reviewed - No data to display  RADIOLOGY I personally viewed and evaluated these images as part of my medical decision making, as well as reviewing the written report by the radiologist.  ED Provider Interpretation: No acute abnormalities noted on CT Lumbar. Multiple bone spurs can be noted. There is a bulging annulus at multiple sites per Radiologist reading causing moderate encroachment of the neural foramina at L3-L4.  No results found.  PROCEDURES:  Critical Care performed: No  Procedures  MEDICATIONS ORDERED IN ED: Medications  cyclobenzaprine (FLEXERIL) tablet 5 mg (5 mg Oral Given 10/24/22 1435)  ketorolac (TORADOL) 15 MG/ML injection 15 mg (15 mg Intramuscular Given 10/24/22 1438)  fentaNYL (SUBLIMAZE) injection 50 mcg (50 mcg Intramuscular Given 10/24/22 1641)    IMPRESSION / MDM /  ASSESSMENT AND PLAN / ED COURSE  I reviewed the triage vital signs and the nursing notes.                              Clinical Course as of 10/25/22 2323  Wynelle Link Oct 24, 2022  1633 Pain is better sitting up 2 spinal surgical history  [MH]  1659 Significant improvement with fentanyl. Patient is sitting on side of the bed in a better mood. Vitals stable for d/c home.  [MH]    Clinical Course User Index [MH] Conrad Poynette, PA-C    72 y.o. male presents to the emergency department for evaluation and treatment of acute on chronic back pain with left sided descending radiation clinically consistent with radiculopathy. See HPI for further details. Physical exam is pertinent for midline tenderness of the lumbar region and a positive left straight leg raise test.     Differential diagnosis includes, but is not limited to muscle strain, fracture, sciatica, disc herniation, cauda equina, osteoarthritis.   There is no focal neuro deficit and the neurovascular status is intact all throughout the lower extremity bilaterally making cauda equina less likely. Patient is administered Toradol, flexeril, and a lidocaine patch for pain. CT of the lumbar spine reveals a bulging annulus and bony spurs at multiple levels causing moderate encroachment of the neural foramina. On reassessment, the medication stated above provided no symptom relief. Patient then given fentanyl with significant improvement. Please see ED clinical course.   Patient is ready to be discharged. He is in stable condition. Vitals are reassuring. We briefly dicussed follow up plans with his spinal surgeon Dr. Clotilde Dieter who performed his previous spinal surgeries. I encouraged to schedule an appointment. I also advised the patient to continue to go to his scheduled appointment on Friday with his primary care physician, Dr. Sunday Corn. Patient verbalized understanding. Patient is given ED precautions to return to the ED for any worsening or new symptoms. All questions and concerns were addressed during ED visit.    Patient's presentation is most consistent with acute complicated illness / injury requiring diagnostic workup.  FINAL CLINICAL IMPRESSION(S) / ED DIAGNOSES   Final diagnoses:  Acute left-sided low back pain with sciatica, sciatica laterality unspecified     Rx / DC Orders   ED Discharge Orders     None        Note:  This document was prepared using Dragon voice recognition software and may include unintentional dictation errors.    Romeo Apple, Demontae Antunes A, PA-C 10/25/22 2349    Trinna Post, MD 10/27/22 [phone]

## 2022-10-24 NOTE — ED Triage Notes (Signed)
Pt states coming in for severe back pain that radiates down the left leg. Pt was seen at [facility] and brought over here. Pt states Friday night the back pain started and then yesterday it got worse. Pt states no known injury or trauma. Pt states taking hydrocodone with no relief.

## 2022-10-27 ENCOUNTER — Other Ambulatory Visit: Payer: Self-pay | Admitting: Internal Medicine

## 2022-10-27 ENCOUNTER — Ambulatory Visit
Admission: RE | Admit: 2022-10-27 | Discharge: 2022-10-27 | Disposition: A | Discharge status: Home / Self Care | Payer: Medicare Other | Source: Ambulatory Visit | Attending: Internal Medicine | Admitting: Internal Medicine

## 2022-10-27 DIAGNOSIS — G959 Disease of spinal cord, unspecified: Secondary | ICD-10-CM | POA: Diagnosis present

## 2022-10-27 MED ORDER — GADOBUTROL 1 MMOL/ML IV SOLN
10.0000 mL | Freq: Once | INTRAVENOUS | Status: AC | PRN
  Administered 2022-10-27: 10 mL via INTRAVENOUS

## 2023-11-16 ENCOUNTER — Encounter: Payer: Self-pay | Admitting: Internal Medicine

## 2023-11-23 ENCOUNTER — Encounter: Payer: Self-pay | Admitting: Internal Medicine

## 2023-11-23 ENCOUNTER — Ambulatory Visit: Admitting: General Practice

## 2023-11-23 ENCOUNTER — Encounter
Admission: RE | Disposition: A | Discharge status: Home / Self Care | Payer: Self-pay | Source: Home / Self Care | Attending: Internal Medicine

## 2023-11-23 ENCOUNTER — Ambulatory Visit
Admission: RE | Admit: 2023-11-23 | Discharge: 2023-11-23 | Disposition: A | Discharge status: Home / Self Care | Attending: Internal Medicine | Admitting: Internal Medicine

## 2023-11-23 DIAGNOSIS — K640 First degree hemorrhoids: Secondary | ICD-10-CM | POA: Diagnosis not present

## 2023-11-23 DIAGNOSIS — K5730 Diverticulosis of large intestine without perforation or abscess without bleeding: Secondary | ICD-10-CM | POA: Diagnosis not present

## 2023-11-23 DIAGNOSIS — I10 Essential (primary) hypertension: Secondary | ICD-10-CM | POA: Insufficient documentation

## 2023-11-23 DIAGNOSIS — G4730 Sleep apnea, unspecified: Secondary | ICD-10-CM | POA: Insufficient documentation

## 2023-11-23 DIAGNOSIS — Z79899 Other long term (current) drug therapy: Secondary | ICD-10-CM | POA: Diagnosis not present

## 2023-11-23 DIAGNOSIS — Z860101 Personal history of adenomatous and serrated colon polyps: Secondary | ICD-10-CM | POA: Diagnosis present

## 2023-11-23 DIAGNOSIS — Z1211 Encounter for screening for malignant neoplasm of colon: Secondary | ICD-10-CM | POA: Insufficient documentation

## 2023-11-23 HISTORY — PX: HEMOSTASIS CLIP PLACEMENT: SHX6857

## 2023-11-23 HISTORY — PX: COLONOSCOPY: SHX5424

## 2023-11-23 HISTORY — PX: POLYPECTOMY: SHX149

## 2023-11-23 HISTORY — PX: SUBMUCOSAL INJECTION: SHX5543

## 2023-11-23 SURGERY — COLONOSCOPY
Anesthesia: General

## 2023-11-23 MED ORDER — PHENYLEPHRINE 80 MCG/ML (10ML) SYRINGE FOR IV PUSH (FOR BLOOD PRESSURE SUPPORT)
PREFILLED_SYRINGE | INTRAVENOUS | Status: DC | PRN
  Administered 2023-11-23: 160 ug via INTRAVENOUS

## 2023-11-23 MED ORDER — PROPOFOL 500 MG/50ML IV EMUL
INTRAVENOUS | Status: DC | PRN
  Administered 2023-11-23: 75 ug/kg/min via INTRAVENOUS

## 2023-11-23 MED ORDER — EPHEDRINE SULFATE-NACL 50-0.9 MG/10ML-% IV SOSY
PREFILLED_SYRINGE | INTRAVENOUS | Status: DC | PRN
  Administered 2023-11-23: 10 mg via INTRAVENOUS
  Administered 2023-11-23: 15 mg via INTRAVENOUS

## 2023-11-23 MED ORDER — DEXMEDETOMIDINE HCL IN NACL 80 MCG/20ML IV SOLN
INTRAVENOUS | Status: DC | PRN
  Administered 2023-11-23: 12 ug via INTRAVENOUS
  Administered 2023-11-23: 8 ug via INTRAVENOUS

## 2023-11-23 MED ORDER — SODIUM CHLORIDE 0.9 % IV SOLN: INTRAVENOUS | Status: DC

## 2023-11-23 MED ORDER — PROPOFOL 10 MG/ML IV BOLUS
INTRAVENOUS | Status: DC | PRN
  Administered 2023-11-23 (×3): 20 mg via INTRAVENOUS
  Administered 2023-11-23: 40 mg via INTRAVENOUS
  Administered 2023-11-23: 30 mg via INTRAVENOUS

## 2023-11-23 MED ORDER — SPOT INK MARKER SYRINGE KIT
PACK | SUBMUCOSAL | Status: DC | PRN
  Administered 2023-11-23: 4 mL via SUBMUCOSAL

## 2023-11-23 MED ORDER — LIDOCAINE HCL (CARDIAC) PF 100 MG/5ML IV SOSY
PREFILLED_SYRINGE | INTRAVENOUS | Status: DC | PRN
  Administered 2023-11-23: 80 mg via INTRAVENOUS

## 2023-11-23 NOTE — H&P (Signed)
 Outpatient short stay form Pre-procedure 11/23/2023 9:27 AM Jerry Boyd, M.D.  Primary Physician: Oneil Pinal, M.D.  Reason for visit:  personal history of colon polyps   History of present illness:                            Patient presents for colonoscopy for a personal hx of colon polyps. The patient denies abdominal pain, abnormal weight loss or rectal bleeding.      Current Facility-Administered Medications:    0.9 %  sodium chloride  infusion, , Intravenous, Continuous, [place], Paiton Boultinghouse K, MD, Last Rate: 20 mL/hr at 11/23/23 9078, Continued from Pre-op at 11/23/23 9078  Facility-Administered Medications Ordered in Other Encounters:    dexmedetomidine  (PRECEDEX ) 80 MCG/20ML, , Intravenous, Anesthesia Intra-op, Stacie Channel, CRNA, 8 mcg at 11/23/23 9075  Medications Prior to Admission  Medication Sig Dispense Refill Last Dose/Taking   aspirin EC 81 MG tablet Take 81 mg by mouth daily. Swallow whole.   Past Week   CHOLECALCIFEROL-VITAMIN C PO Take 2,000 Units by mouth daily.   Past Week   ferrous gluconate (FERGON) 324 MG tablet Take 324 mg by mouth daily with breakfast.   Past Week   tirzepatide (MOUNJARO) 5 MG/0.5ML Pen Inject 5 mg into the skin once a week.   11/12/2023   acetaminophen (TYLENOL) 650 MG CR tablet Take 650 mg by mouth as needed for pain (Take 3 tabs at night (2-3x/week).      cetirizine (ZYRTEC) 10 MG tablet Take 10 mg by mouth daily.      HYDROcodone-acetaminophen (NORCO) 10-325 MG tablet Take 1 tablet by mouth 2 (two) times daily as needed. (Patient not taking: Reported on 11/23/2023)   Completed Course   nabumetone (RELAFEN) 500 MG tablet Take 500 mg by mouth 2 (two) times daily.      olmesartan (BENICAR) 20 MG tablet Take 20 mg by mouth daily.      polyethylene glycol (MIRALAX / GLYCOLAX) packet Take 17 g by mouth daily. Mix in [place]. of fluid      pramipexole (MIRAPEX) 0.125 MG tablet Take 0.125 mg by mouth at bedtime.      Pseudoephedrine-Ibuprofen  30-200 MG TABS Take by mouth as needed (Advil Cold and Sinus).        Allergies  Allergen Reactions   Codeine Rash     Past Medical History:  Diagnosis Date   Arthritis    osteoarthritis   Chronic kidney disease    kidney stones   Hypertension    Lumbar herniated disc    recurrent L4-L5   Sleep apnea    uses CPAP    Review of systems:  Otherwise negative.    Physical Exam  Gen: Alert, oriented. Appears stated age.  HEENT: [place]/AT. PERRLA. Lungs: CTA, no wheezes. CV: RR nl S1, S2. Abd: soft, benign, no masses. BS+ Ext: No edema. Pulses 2+    Planned procedures: Proceed with colonoscopy. The patient understands the nature of the planned procedure, indications, risks, alternatives and potential complications including but not limited to bleeding, infection, perforation, damage to internal organs and possible oversedation/side effects from anesthesia. The patient agrees and gives consent to proceed.  Please refer to procedure notes for findings, recommendations and patient disposition/instructions.     Jerry Boyd K. Boyd, M.D. Gastroenterology 11/23/2023  9:27 AM

## 2023-11-23 NOTE — Transfer of Care (Signed)
 Immediate Anesthesia Transfer of Care Note  Patient: Jerry Boyd  Procedure(s) Performed: COLONOSCOPY INJECTION, SUBMUCOSAL POLYPECTOMY, INTESTINE CONTROL OF HEMORRHAGE, GI TRACT, ENDOSCOPIC, BY CLIPPING OR OVERSEWING  Patient Location: PACU  Anesthesia Type:General  Level of Consciousness: sedated  Airway & Oxygen Therapy: Patient Spontanous Breathing  Post-op Assessment: Report given to RN and Post -op Vital signs reviewed and stable  Post vital signs: Reviewed and stable  Last Vitals:  Vitals Value Taken Time  BP 88/60 11/23/23 10:11  Temp    Pulse 85 11/23/23 10:12  Resp 22 11/23/23 10:12  SpO2 93 % 11/23/23 10:12  Vitals shown include unfiled device data.  Last Pain:  Vitals:   11/23/23 0815  TempSrc: Temporal         Complications: No notable events documented.

## 2023-11-23 NOTE — Anesthesia Postprocedure Evaluation (Signed)
 Anesthesia Post Note  Patient: Jerry Boyd  Procedure(s) Performed: COLONOSCOPY INJECTION, SUBMUCOSAL POLYPECTOMY, INTESTINE CONTROL OF HEMORRHAGE, GI TRACT, ENDOSCOPIC, BY CLIPPING OR OVERSEWING  Patient location during evaluation: Endoscopy Anesthesia Type: General Level of consciousness: awake and alert Pain management: pain level controlled Vital Signs Assessment: post-procedure vital signs reviewed and stable Respiratory status: spontaneous breathing, nonlabored ventilation, respiratory function stable and patient connected to nasal cannula oxygen Cardiovascular status: blood pressure returned to baseline and stable Postop Assessment: no apparent nausea or vomiting Anesthetic complications: no   No notable events documented.   Last Vitals:  Vitals:   11/23/23 1026 11/23/23 1033  BP: 117/70 97/64  Pulse: 69 64  Resp: 18 (!) 22  Temp: (!) 36.2 C (!) 36.3 C  SpO2: 96% 95%    Last Pain:  Vitals:   11/23/23 1033  TempSrc: Temporal  PainSc: 0-No pain                 Debby Mines

## 2023-11-23 NOTE — Op Note (Signed)
 [facility] Gastroenterology Patient Name: Jerry Boyd Procedure Date: 11/23/2023 9:19 AM MRN: [id number] Account #: [id number] Date of Birth: 12-Dec-1950 Admit Type: Outpatient Age: 73 Room: [facility] ENDO ROOM 3 Gender: Male Note Status: Finalized Instrument Name: [id number] Procedure:             Colonoscopy Indications:           High risk colon cancer surveillance: Personal history                         of non-advanced adenoma Providers:             Vanette Noguchi K. Aleese Kamps MD, MD Medicines:             Propofol  per Anesthesia Complications:         No immediate complications. Estimated blood loss:                         Minimal. Procedure:             Pre-Anesthesia Assessment:                        - The risks and benefits of the procedure and the                         sedation options and risks were discussed with the                         patient. All questions were answered and informed                         consent was obtained.                        - Patient identification and proposed procedure were                         verified prior to the procedure by the nurse. The                         procedure was verified in the procedure room.                        - ASA Grade Assessment: III - A patient with severe                         systemic disease.                        - After reviewing the risks and benefits, the patient                         was deemed in satisfactory condition to undergo the                         procedure.                        After obtaining informed consent, the colonoscope was  passed under direct vision. Throughout the procedure,                         the patient's blood pressure, pulse, and oxygen                         saturations were monitored continuously. The                         Colonoscope was introduced through the anus and                         advanced to the  the cecum, identified by appendiceal                         orifice and ileocecal valve. The colonoscopy was                         somewhat difficult due to significant looping.                         Successful completion of the procedure was aided by                         applying abdominal pressure. The patient tolerated the                         procedure well. The quality of the bowel preparation                         was adequate. The ileocecal valve, appendiceal                         orifice, and rectum were photographed. Findings:      The perianal and digital rectal examinations were normal. Pertinent       negatives include normal sphincter tone and no palpable rectal lesions.      Non-bleeding internal hemorrhoids were found during retroflexion. The       hemorrhoids were Grade I (internal hemorrhoids that do not prolapse).      Many small-mouthed diverticula were found in the sigmoid colon. There       was no evidence of diverticular bleeding.      A 30 mm polypoid lesion was found in the transverse colon. The lesion       was sessile. No bleeding was present. The polyp was removed with a       piecemeal technique using a hot snare. Polyp resection was incomplete.       The resected tissue was retrieved. Estimated blood loss was minimal.       Area was unsuccessfully injected with 3 mL [place] for a lift       polypectomy. Estimated blood loss was minimal. To prevent bleeding after       the polypectomy, two hemostatic clips were successfully placed (MR       conditional). Clip manufacturer: [employer]. There was no       bleeding during, or at the end, of the procedure. Estimated blood loss       was minimal. Area was successfully injected with 4 mL Spot (carbon  black) for tattooing. Estimated blood loss was minimal.      The exam was otherwise without abnormality. Impression:            - Non-bleeding internal hemorrhoids.                        -  Mild diverticulosis in the sigmoid colon. There was                         no evidence of diverticular bleeding.                        - Rule out malignancy, polypoid lesion in the                         transverse colon. Tissue was removed. Treatment not                         successful. Clips (MR conditional) were placed. Clip                         manufacturer: [employer]. Injected.                        - The examination was otherwise normal. Recommendation:        - Patient has a contact number available for                         emergencies. The signs and symptoms of potential                         delayed complications were discussed with the patient.                         Return to normal activities tomorrow. Written                         discharge instructions were provided to the patient.                        - Resume previous diet.                        - Continue present medications.                        - Await pathology results.                        - If the pathology report reveals adenomatous tissue,                         then refer to a surgeon at appointment to be scheduled.                        - Return to my office PRN.                        - The findings and recommendations were discussed with  the patient. Procedure Code(s):     --- Professional ---                        [phone], Colonoscopy, flexible; with removal of                         tumor(s), polyp(s), or other lesion(s) by snare                         technique                        45381, Colonoscopy, flexible; with directed submucosal                         injection(s), any substance Diagnosis Code(s):     --- Professional ---                        K57.30, Diverticulosis of large intestine without                         perforation or abscess without bleeding                        D49.0, Neoplasm of unspecified behavior of digestive                          system                        K64.0, First degree hemorrhoids                        Z86.010, Personal history of colonic polyps CPT copyright 2022 American Medical Association. All rights reserved. The codes documented in this report are preliminary and upon coder review may  be revised to meet current compliance requirements. Ladell MARLA Boss MD, MD 11/23/2023 10:15:23 AM This report has been signed electronically. Number of Addenda: 0 Note Initiated On: 11/23/2023 9:19 AM Scope Withdrawal Time: 0 hours 31 minutes 5 seconds  Total Procedure Duration: 0 hours 36 minutes 23 seconds  Estimated Blood Loss:  Estimated blood loss was minimal.      [facility]

## 2023-11-23 NOTE — Interval H&P Note (Signed)
 History and Physical Interval Note:  11/23/2023 9:27 AM  Jerry Boyd  has presented today for surgery, with the diagnosis of Hx of adenomatous colonic polyps [Z86.0101].  The various methods of treatment have been discussed with the patient and family. After consideration of risks, benefits and other options for treatment, the patient has consented to  Procedure(s): COLONOSCOPY (N/A) as a surgical intervention.  The patient's history has been reviewed, patient examined, no change in status, stable for surgery.  I have reviewed the patient's chart and labs.  Questions were answered to the patient's satisfaction.     [place], [name]

## 2023-11-23 NOTE — Anesthesia Preprocedure Evaluation (Signed)
 Anesthesia Evaluation  Patient identified by MRN, date of birth, ID band Patient awake    Reviewed: Allergy & Precautions, NPO status , Patient's Chart, lab work & pertinent test results, reviewed documented beta blocker date and time   Airway Mallampati: III  TM Distance: >3 FB Neck ROM: full    Dental  (+) Upper Dentures, Lower Dentures   Pulmonary sleep apnea and Continuous Positive Airway Pressure Ventilation    Pulmonary exam normal        Cardiovascular hypertension, Pt. on medications negative cardio ROS Normal cardiovascular exam     Neuro/Psych negative neurological ROS  negative psych ROS   GI/Hepatic negative GI ROS, Neg liver ROS,,,  Endo/Other  negative endocrine ROS    Renal/GU   negative genitourinary   Musculoskeletal   Abdominal   Peds  Hematology negative hematology ROS (+)   Anesthesia Other Findings Past Medical History: No date: Arthritis     Comment:  osteoarthritis No date: Chronic kidney disease     Comment:  kidney stones No date: Hypertension No date: Lumbar herniated disc     Comment:  recurrent L4-L5 No date: Sleep apnea     Comment:  uses CPAP  Past Surgical History: No date: APPENDECTOMY 2014: BACK SURGERY     Comment:  Reexploration surgeries x2 05/17/2018: COLONOSCOPY WITH PROPOFOL ; N/A     Comment:  Procedure: COLONOSCOPY WITH PROPOFOL ;  Surgeon: [place],               Ladell POUR, MD;  Location: [facility] ENDOSCOPY;  Service:               Gastroenterology;  Laterality: N/A; 05/17/2018: ESOPHAGOGASTRODUODENOSCOPY (EGD) WITH PROPOFOL ; N/A     Comment:  Procedure: ESOPHAGOGASTRODUODENOSCOPY (EGD) WITH               PROPOFOL ;  Surgeon: [place], Ladell POUR, MD;  Location:               [facility] ENDOSCOPY;  Service: Gastroenterology;  Laterality:               N/A; No date: EYE SURGERY     Comment:  bilateral cataract No date: FRACTURE SURGERY     Comment:  ORIF finger/thumb fracture No  date: SHOULDER ARTHROSCOPY No date: TONSILLECTOMY     Comment:  and adnoidectomy No date: VASECTOMY     Reproductive/Obstetrics negative OB ROS                              Anesthesia Physical Anesthesia Plan  ASA: 2  Anesthesia Plan: General   Post-op Pain Management: Minimal or no pain anticipated   Induction: Intravenous  PONV Risk Score and Plan: 2 and Propofol  infusion and TIVA  Airway Management Planned: Nasal Cannula  Additional Equipment: None  Intra-op Plan:   Post-operative Plan:   Informed Consent: I have reviewed the patients History and Physical, chart, labs and discussed the procedure including the risks, benefits and alternatives for the proposed anesthesia with the patient or authorized representative who has indicated his/her understanding and acceptance.     Dental advisory given  Plan Discussed with: CRNA and Surgeon  Anesthesia Plan Comments: (Discussed risks of anesthesia with patient, including possibility of difficulty with spontaneous ventilation under anesthesia necessitating airway intervention, PONV, and rare risks such as cardiac or respiratory or neurological events, and allergic reactions. Discussed the role of CRNA in patient's perioperative care. Patient understands.)  Anesthesia Quick Evaluation

## 2023-11-25 LAB — SURGICAL PATHOLOGY

## 2023-12-19 ENCOUNTER — Ambulatory Visit: Attending: Otolaryngology

## 2023-12-19 DIAGNOSIS — G4733 Obstructive sleep apnea (adult) (pediatric): Secondary | ICD-10-CM | POA: Diagnosis present

## 2024-01-16 ENCOUNTER — Ambulatory Visit: Attending: Otolaryngology

## 2024-01-16 DIAGNOSIS — G4731 Primary central sleep apnea: Secondary | ICD-10-CM | POA: Insufficient documentation

## 2024-01-16 DIAGNOSIS — G478 Other sleep disorders: Secondary | ICD-10-CM | POA: Insufficient documentation

## 2024-01-16 DIAGNOSIS — G4733 Obstructive sleep apnea (adult) (pediatric): Secondary | ICD-10-CM | POA: Insufficient documentation
